# Patient Record
Sex: Male | Born: 1995 | Race: White | Hispanic: No | Marital: Single | Smoking: Former smoker
Health system: Southern US, Community
[De-identification: ages and names within clinical notes are randomized; demographics above are authoritative.]

## PROBLEM LIST (undated history)

## (undated) DIAGNOSIS — S069X9A Unspecified intracranial injury with loss of consciousness of unspecified duration, initial encounter: Secondary | ICD-10-CM

## (undated) DIAGNOSIS — S069XAA Unspecified intracranial injury with loss of consciousness status unknown, initial encounter: Secondary | ICD-10-CM

## (undated) DIAGNOSIS — F419 Anxiety disorder, unspecified: Secondary | ICD-10-CM

## (undated) DIAGNOSIS — G43909 Migraine, unspecified, not intractable, without status migrainosus: Secondary | ICD-10-CM

## (undated) DIAGNOSIS — G4700 Insomnia, unspecified: Secondary | ICD-10-CM

## (undated) HISTORY — DX: Unspecified intracranial injury with loss of consciousness status unknown, initial encounter: S06.9XAA

## (undated) HISTORY — DX: Anxiety disorder, unspecified: F41.9

## (undated) HISTORY — DX: Migraine, unspecified, not intractable, without status migrainosus: G43.909

## (undated) HISTORY — PX: MOUTH SURGERY: SHX715

## (undated) HISTORY — DX: Insomnia, unspecified: G47.00

## (undated) HISTORY — DX: Unspecified intracranial injury with loss of consciousness of unspecified duration, initial encounter: S06.9X9A

---
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
## 2020-06-24 ENCOUNTER — Encounter (INDEPENDENT_AMBULATORY_CARE_PROVIDER_SITE_OTHER): Admitting: Internal Medicine

## 2020-06-24 DIAGNOSIS — G4719 Other hypersomnia: Secondary | ICD-10-CM | POA: Diagnosis not present

## 2020-06-24 DIAGNOSIS — G2581 Restless legs syndrome: Secondary | ICD-10-CM

## 2020-07-02 ENCOUNTER — Ambulatory Visit (INDEPENDENT_AMBULATORY_CARE_PROVIDER_SITE_OTHER): Admitting: Internal Medicine

## 2020-07-02 VITALS — Ht 72.0 in | Wt 175.0 lb

## 2020-07-02 DIAGNOSIS — G4700 Insomnia, unspecified: Secondary | ICD-10-CM

## 2020-07-02 DIAGNOSIS — G2581 Restless legs syndrome: Secondary | ICD-10-CM | POA: Insufficient documentation

## 2020-07-02 NOTE — Progress Notes (Signed)
Sleep Medicine   Office Visit  Patient Name: Bernard Barton DOB: Aug 11, 1995 MRN [id number]    Chief Complaint: study results  Brief History:  Hamp Was seen for initial consultation to discuss the results of his recent sleep study. The findings were unremarkable.  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  He reports difficulty initiating and maintaining sleep since the TBI. His wife reports that his legs move in his sleep and this caused the referral. He does snore The patient goes to bed at 9:30-10 pm. But is not sleepy. and wakes up at 6 a.m.. He would sleep better if he could sleep from 12 p.m to 9 a.m. he wakes twice per night. He admits to ruminating. Patient has noted possible restlessness. of his legs at night.  The patient  relates no unusual behavior during the night.  The patient is being treated for anxiety/panic disorder. The Epworth Sleepiness Score is 0 out of 24 .    ROS  General: (-) fever, (-) chills, (-) night sweat Nose and Sinuses: (-) nasal stuffiness or itchiness, (-) postnasal drip, (-) nosebleeds, (-) sinus trouble. Mouth and Throat: (-) sore throat, (-) hoarseness. Neck: (-) swollen glands, (-) enlarged thyroid, (-) neck pain. Respiratory: - cough, - shortness of breath, - wheezing. Neurologic: - numbness, - tingling. Psychiatric: in counseling for anxiety/panic disorder. Not medication.  Sleep behavior: -sleep paralysis -hypnogogic hallucinations -dream enactment      -vivid dreams -cataplexy -night terrors -sleep walking   Current Medication: Outpatient Encounter Medications as of 07/02/2020  Medication Sig  . SUMAtriptan (IMITREX) 50 MG tablet Take 50 mg by mouth every 2 (two) hours as needed. May repeat in 2 hours if headache persists or recurs.   No facility-administered encounter medications on file as of 07/02/2020.    Surgical History: Past Surgical History:  Procedure Laterality Date  . MOUTH SURGERY      Medical History: Past Medical History:   Diagnosis Date  . Anxiety   . Insomnia   . Migraine   . TBI (traumatic brain injury) (HCC)     Family History: Non contributory to the present illness  Social History: Social History   Socioeconomic History  . Marital status: Single    Spouse name: Not on file  . Number of children: Not on file  . Years of education: Not on file  . Highest education level: Not on file  Occupational History  . Not on file  Tobacco Use  . Smoking status: Former Games developer  . Smokeless tobacco: Never Used  Substance and Sexual Activity  . Alcohol use: Not on file  . Drug use: Not on file  . Sexual activity: Not on file  Other Topics Concern  . Not on file  Social History Narrative  . Not on file   Social Determinants of Health   Financial Resource Strain: Not on file  Food Insecurity: Not on file  Transportation Needs: Not on file  Physical Activity: Not on file  Stress: Not on file  Social Connections: Not on file  Intimate Partner Violence: Not on file    Vital Signs: Height 6' (1.829 m), weight 175 lb (79.4 kg).  Examination: General Appearance: The patient is well-developed, well-nourished, and in no distress. Neck Circumference:  Skin: Gross inspection of skin unremarkable. Head: normocephalic, no gross deformities. Eyes: no gross deformities noted. ENT: ears appear grossly normal Neurologic: Alert and oriented. No involuntary movements.    EPWORTH SLEEPINESS SCALE:  Scale:  (0)= no chance  of dozing; (1)= slight chance of dozing; (2)= moderate chance of dozing; (3)= high chance of dozing  Chance  Situtation    Sitting and reading: 0    Watching TV: 0    Sitting Inactive in public: 0    As a passenger in car: 0     Lying down to rest: 0    Sitting and talking: 0    Sitting quielty after lunch: 0    In a car, stopped in traffic: 0   TOTAL SCORE:   0 out of 24    SLEEP STUDIES:  1. PSG 06/24/2020 AHI: 1.3 MIN SpO2: 80%   LABS: No results found  for this or any previous visit (from the past 2160 hour(s)).  Radiology: DG Wrist Complete Left  Result Date: 08/23/2006 Clinical Data:   Post fall injury with left wrist pain. DIAGNOSTIC LEFT WRIST - 3 VIEW: Comparison:  None. Findings:  Subtle greenstick cortical buckle fracture is seen at the distal left radial diametaphysis sparing the growth plate.  Nondisplaced ulnar styloid acute fracture is seen.  Remaining structures appear normal for patient's age.  IMPRESSION: 1.  Transverse greenstick fracture sparing growth plate at the left radial diametaphysis. 2.  Nondisplaced ulnar styloid fracture. 3.  Otherwise negative. Provider: Deliah Goody   No results found.  No results found.    Assessment and Plan: Patient Active Problem List   Diagnosis Date Noted  . Insomnia 07/02/2020  . Restless leg syndrome 07/02/2020   1. Insomnia, unspecified type PLAN  The study results, diagnosis and treatment recommendations were discussed with the patient. He reports being a late night person with difficulty falling asleep and difficulty getting up in the morning which suggests some delayed sleep phase. Early morning bright light and evening melatonin were recommended. In addition he was advised to try active thinking to prevent ruminating.  Delayed sleep phase syndrome - early morning bright light and melatonin at 5 p.m. were recommended. Insomnia associated with TBI- utilize active thinking.    2. Restless leg syndrome Mild and not interfering with sleep at this time-- will monitor.   General Counseling: I have discussed the findings of the evaluation and examination with Casimiro Needle.  I have also discussed any further diagnostic evaluation thatmay be needed or ordered today. Cristoval verbalizes understanding of the findings of todays visit. We also reviewed his medications today and discussed drug interactions and side effects including but not limited excessive drowsiness and altered mental states.  We also discussed that there is always a risk not just to him but also people around him. he has been encouraged to call the office with any questions or concerns that should arise related to todays visit.  No orders of the defined types were placed in this encounter.       I have personally obtained a history, evaluated the patient, evaluated pertinent data, formulated the assessment and plan and placed orders.  This patient was seen today by Emmaline Kluver, PA-C in collaboration with Dr. Freda Munro.    Valentino Hue Sol Blazing, PhD, FAASM  Diplomate, American Board of Sleep Medicine    Yevonne Pax, MD [facility] Diplomate ABMS Pulmonary and Critical Care Medicine Sleep medicine

## 2020-07-04 NOTE — Procedures (Signed)
[facility]  Polysomnogram Report Part I                                                               Phone: [phone] Fax: [phone]  Patient Name: Bernard Barton, Bernard Barton Acquisition Number: [id number]  Date of Birth: 1995-06-21 Acquisition Date: 06/24/2020  Referring Physician: Roque Cash, MD     History: The patient is a 25 year old male who was referred for evaluation of possible sleep apnea. Medical History: ?Headaches????.  Medications: ??Sumatriptan, Rizatriptan, chlorhexidine gluconat, amoxicillin???.  Procedure: This routine overnight polysomnogram was performed on the [facility] 5 using the standard diagnostic protocol. This included 6 channels of EEG, 2 channels of EOG, chin EMG, bilateral anterior tibialis EMG, nasal/oral thermistor, PTAF (nasal pressure transducer), chest and abdominal wall movements, EKG, and pulse oximetry.  Description: The total recording time was 428.1 minutes. The total sleep time was 314.5 minutes. There were a total of 80.1 minutes of wakefulness after sleep onset for a?reduced?sleep efficiency of 73.5%. The latency to sleep onset was ??slightly prolonged at 33.5 minutes. The R sleep onset latency was prolonged at 203.0 minutes. Sleep parameters, as a percentage of the total sleep time, demonstrated 59.6% of sleep was in N1 sleep, 23.2% N2, 0.6% N3 and 16.5% R sleep. There were a total of 24 arousals for an arousal index of 4.6 arousals per hour of sleep that was normal.???  Respiratory monitoring demonstrated mild snoring. There were 7 apneas and hypopneas for an Apnea Hypopnea Index of 1.3 apneas and hypopneas per hour of sleep. The REM related apnea hypopnea index was 1.2/hr of REM sleep compared to a NREM AHI of 1.4/hr.  The average duration of the respiratory events was 24.0 seconds with a maximum duration of 35.0 seconds. The respiratory events occurred in the supine position. The respiratory events were associated  with peripheral oxygen desaturations on the average to 80%. The lowest oxygen desaturation associated with a respiratory event was 80%. Additionally, the baseline oxygen saturation during wakefulness was 97%, during NREM sleep averaged 96%, and during REM sleep averaged  97%. The total duration of oxygen < 90% was 1.8 minutes.  Cardiac monitoring- There were no significant cardiac rhythm irregularities.   Periodic limb movement monitoring- demonstrated that there were 26 periodic limb movements for a periodic limb movement index of 5.0 periodic limb movements per hour of sleep. Quasi-periodic limb movements were observed during periods of wakefulness.  Impression: ?This routine overnight polysomnogram did not demonstrate significant obstructive sleep apnea due to a low Apnea Hypopnea Index of 1.3 apneas and hypopneas per hour.   There were few periodic limb movements during sleep, however quasi-periodic limb movements were observed during periods of wakefulness, especially during the prolonged middle of the night  awakening. Clinical correlation would be suggested.   There was a reduced sleep efficiency with a reduced REM percentage and virtually no slow wave sleep These findings would appear to be due to the combination of limb movements and psychophysiological factors. Sleep hygiene recommendations should be reviewed. The patient may benefit from cognitive behavioral therapy for insomnia. This can be done with a mental health professional or via web-based programs.   Recommendations:  1. Would recommend evaluation for possible restless leg syndrome. ??    Yevonne Pax, MD, [facility] Diplomate ABMS-Pulmonary, Critical Care and Sleep Medicine  Electronically reviewed and digitally signed    [facility] Polysomnogram Report Part II  Phone: [phone] Fax: [phone]  Patient last name Closs Neck Size 15.0 in. Acquisition [phone]  Patient first name Bernard Barton Weight  175.0 lbs. Started 06/24/2020 at 9:45:15 PM  Birth date 27-May-1995 Height 73.0 in. Stopped 06/25/2020 at 4:53:21 AM  Age 25 BMI 23.1 lb/in2 Duration 428.1  Study Type Adult      Jay Schlichter, RPSGT  Johnanna Schneiders Sleep Data: Lights Out: 9:45:15 PM Sleep Onset: 10:18:45 PM  Lights On: 4:53:21 AM Sleep Efficiency: 73.5 %  Total Recording Time: 428.1 min Sleep Latency (from Lights Off) 33.5 min  Total Sleep Time (TST): 314.5 min R Latency (from Sleep Onset): 203.0 min  Sleep Period Time: 386.5 min Total number of awakenings: 14  Wake during sleep: 72.0 min Wake After Sleep Onset (WASO): 80.1 min   Sleep Data:         Arousal Summary: Stage  Latency from lights out (min) Latency from sleep onset (min) Duration (min) % Total Sleep Time  Normal values  N 1 33.5 0.0 187.5 59.6 (5%)  N 2 37.5 4.0 73.0 23.2 (50%)  N 3 78.0 44.5 2.0 0.6 (20%)  R 236.5 203.0 52.0 16.5 (25%)    Number Index  Spontaneous 17 3.2  Apneas & Hypopneas 2 0.4  RERAs 0 0.0       (Apneas & Hypopneas & RERAs)  (2) (0.4)  Limb Movement 5 1.0  Snore 0 0.0  TOTAL 24 4.6      Respiratory Data:  CA OA MA Apnea Hypopnea* A+ H RERA Total  Number 1 2 0 3 4 7  0 7  Mean Dur (sec) 16.5 29.8 0.0 25.3 23.0 24.0 0.0 24.0  Max Dur (sec) 16.5 30.5 0.0 30.5 35.0 35.0 0.0 35.0  Total Dur (min) 0.3 1.0 0.0 1.3 1.5 2.8 0.0 2.8  % of TST 0.1 0.3 0.0 0.4 0.5 0.9 0.0 0.9  Index (#/h TST) 0.2 0.4 0.0 0.6 0.8 1.3 0.0 1.3  *Hypopneas scored based on 4% or greater desaturation.  Sleep Stage:        REM NREM TST  AHI 1.2 1.4 1.3  RDI 1.2 1.4 1.3            Body Position Data:  Sleep (min) TST (%) REM (min) NREM (min) CA (#) OA (#) MA (#) HYP (#) AHI (#/h) RERA (#) RDI (#/h) Desat (#)  Supine 302.8 96.28 52.0 250.8 1 2  0 4 1.4 0 1.4 21  Non-Supine 11.70 3.72 0.00 11.70 0.00 0.00 0.00 0.00 0.00 0 0.00 0.00  Left: 0.0 0.00 0.0 0.0 0 0 0 0 0.0 0 0.00 0  UP: 11.7 3.72 0.0 11.7 0 0 0 0 0.0 0 0.00 0      Snoring: Total number of snoring episodes  0  Total time with snoring    min (   % of sleep)   Oximetry Distribution:             WK REM NREM TOTAL  Average (%)   97 97 96 96  < 90% 0.1 0.0 1.7 1.8  < 80% 0.1 0.0 1.3 1.4  < 70% 0.1 0.0 0.7 0.8  # of Desaturations* 1 2 19 22   Desat Index (#/hour) 0.6 2.3 4.4 4.2  Desat  Max (%) 3 5 38 38  Desat Max Dur (sec) 18.0 68.0 57.0 68.0  Approx Min O2 during sleep 60  Approx min O2 during a respiratory event 80  Was Oxygen added (Y/N) and final rate No:   0 LPM  *Desaturations based on 3% or greater drop from baseline.   Cheyne Stokes Breathing: None Present   Heart Rate Summary:  Average Heart Rate During Sleep 49.7 bpm      Highest Heart Rate During Sleep (95th %) 57.0 bpm      Highest Heart Rate During Sleep 166 bpm (artifact)  Highest Heart Rate During Recording (TIB) 180 bpm (artifact)   Heart Rate Observations: Event Type # Events   Bradycardia 0 Lowest HR Scored: N/A  Sinus Tachycardia During Sleep 0 Highest HR Scored: N/A  Narrow Complex Tachycardia 0 Highest HR Scored: N/A  Wide Complex Tachycardia 0 Highest HR Scored: N/A  Asystole 0 Longest Pause: N/A  Atrial Fibrillation 0 Duration Longest Event: N/A  Other Arrythmias  No Type:    Periodic Limb Movement Data: (Primary legs unless otherwise noted) Total # Limb Movement 64 Limb Movement Index 12.2  Total # PLMS 26 PLMS Index 5.0  Total # PLMS Arousals 1 PLMS Arousal Index 0.2  Percentage Sleep Time with PLMS 14.28min (4.4 % sleep)  Mean Duration limb movements (secs) 209.6
